# Patient Record
Sex: Female | Born: 1945 | Race: Black or African American | Hispanic: No | Marital: Single | Smoking: Never smoker
Health system: Southern US, Community
[De-identification: ages and names within clinical notes are randomized; demographics above are authoritative.]

## PROBLEM LIST (undated history)

## (undated) HISTORY — PX: OOPHORECTOMY: SHX86

---

## 2004-12-20 ENCOUNTER — Ambulatory Visit: Payer: Self-pay | Admitting: Obstetrics and Gynecology

## 2005-01-11 ENCOUNTER — Ambulatory Visit: Payer: Self-pay | Admitting: Obstetrics and Gynecology

## 2005-01-12 ENCOUNTER — Ambulatory Visit: Payer: Self-pay | Admitting: Obstetrics and Gynecology

## 2006-04-19 ENCOUNTER — Ambulatory Visit: Payer: Self-pay | Admitting: Obstetrics and Gynecology

## 2006-07-12 IMAGING — US ULTRASOUND LEFT BREAST
1 series · 12 of 12 positions shown · non-contrast
Comparison: none

REASON FOR EXAM: Nodular density LEFT breast
COMMENTS:

PROCEDURE:     US  - US BREAST LEFT  - January 12, 2005  [DATE]
RESULT:     A 4-mm hypoechoic nodule containing internal echoes is noted.
This is not a simple cyst.  Surgical removal is suggested.

[Series 1: ultrasound left breast · 12 of 12 slices shown]
[im 1/12]
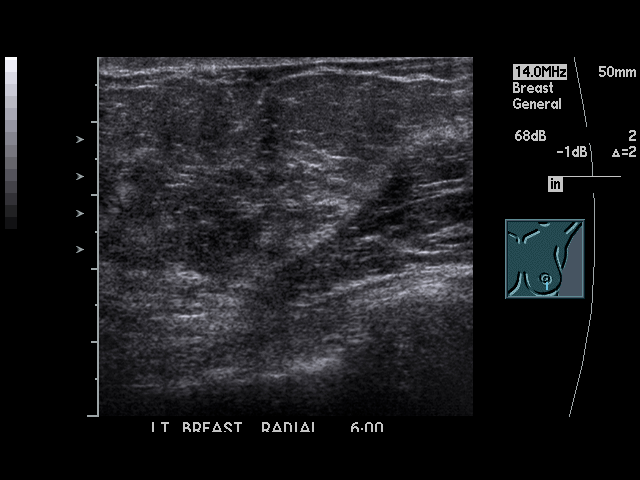
[im 2/12]
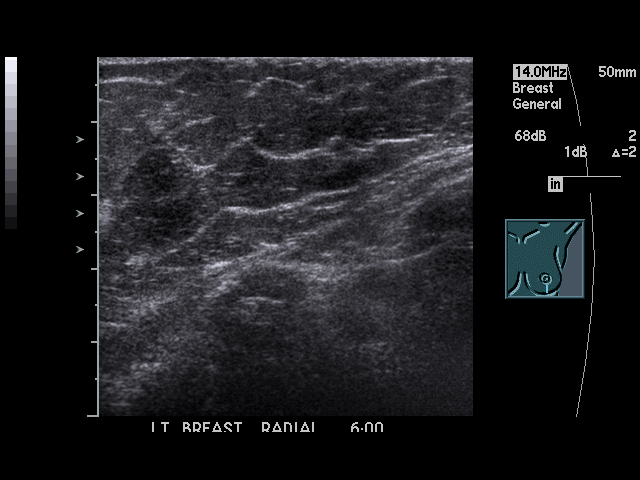
[im 3/12]
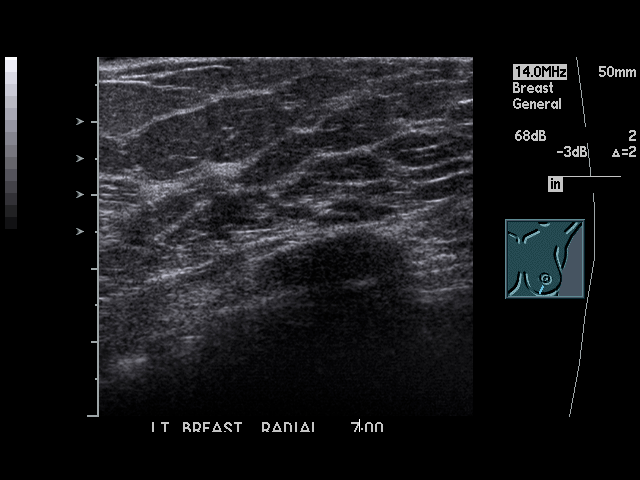
[im 4/12]
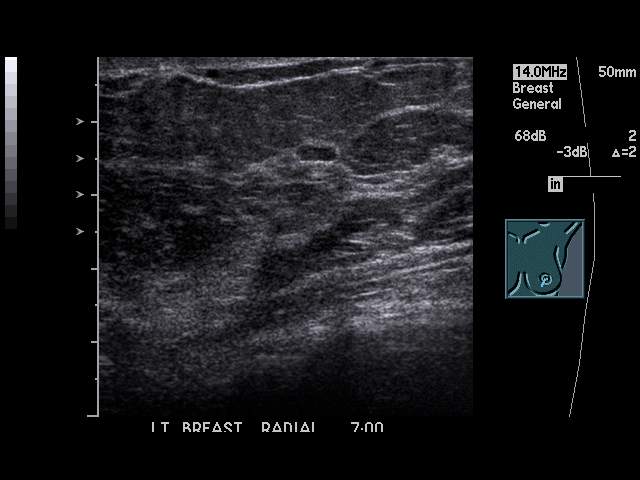
[im 5/12]
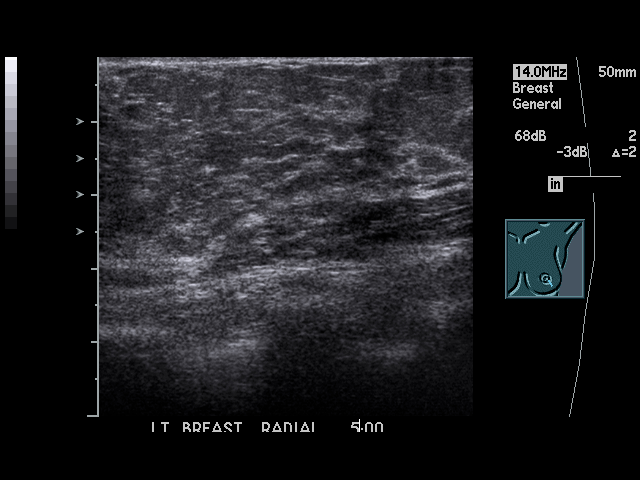
[im 6/12]
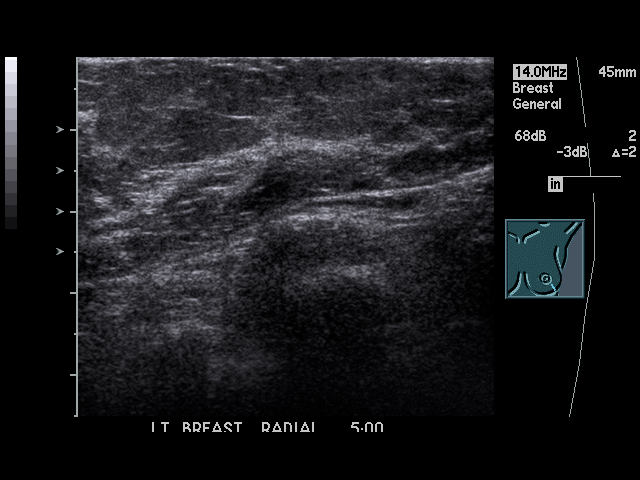
[im 7/12]
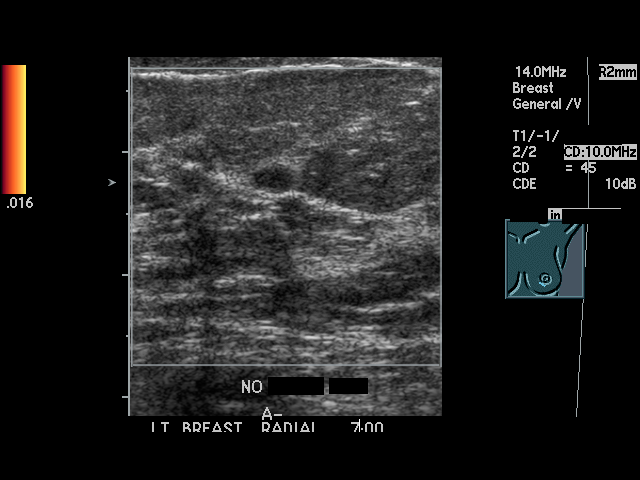
[im 8/12]
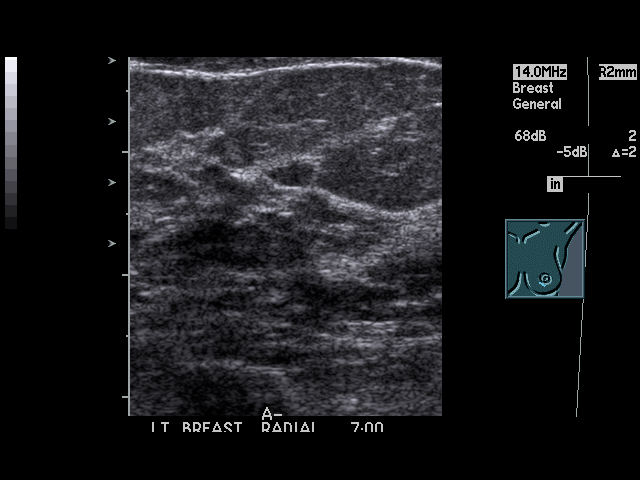
[im 9/12]
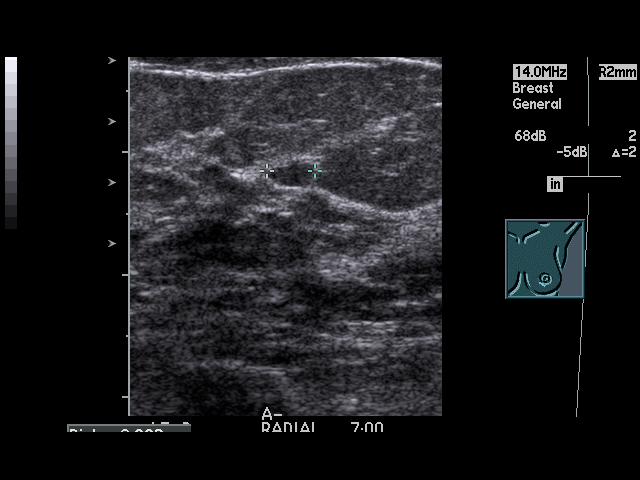
[im 10/12]
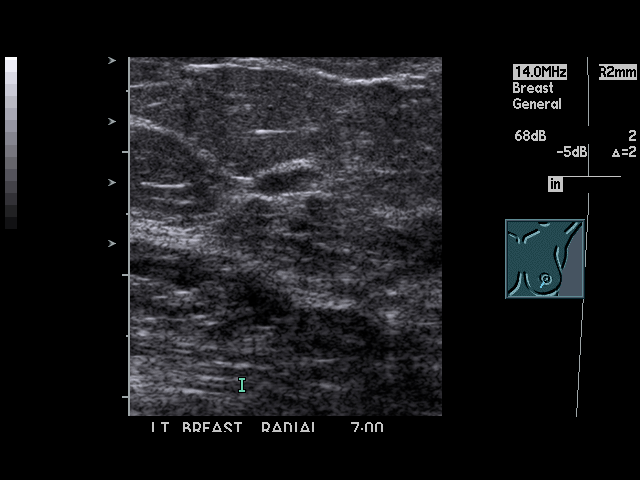
[im 11/12]
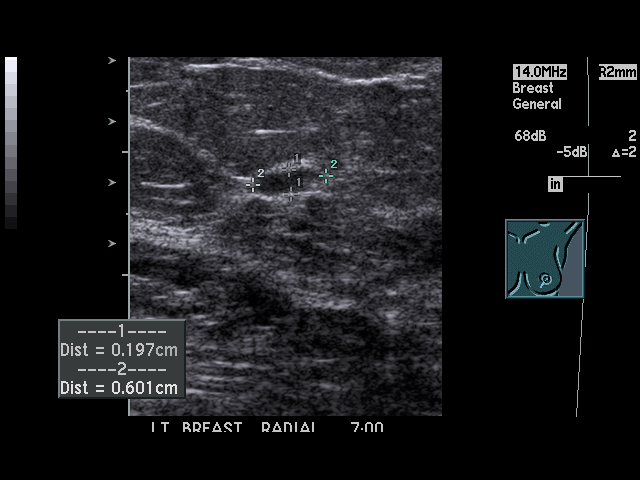
[im 12/12]
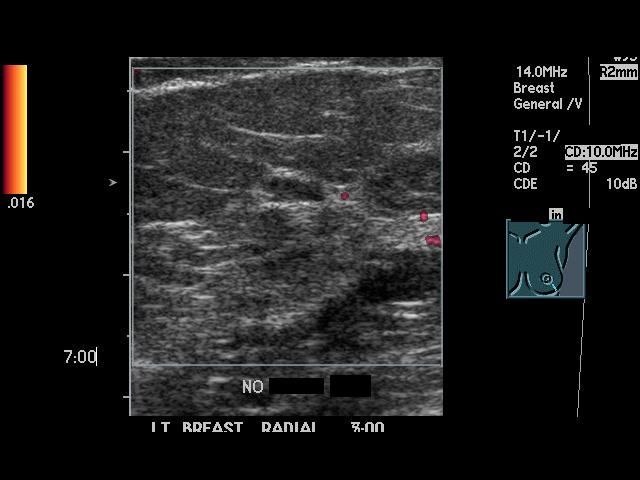

[12 of 12 positions shown; findings below may reference images not displayed]

IMPRESSION: Small 4-mm nodule in the 7 o'clock region of the LEFT breast in the region
of mammographic abnormality.  Surgical removal of this tiny nodule is
suggested.  We can perform needle localization under ultrasound guidance as
desired.

BI-RADS:  Category 4 - Suspicious Abnormality.

## 2007-04-25 ENCOUNTER — Ambulatory Visit: Payer: Self-pay | Admitting: Obstetrics and Gynecology

## 2008-10-08 ENCOUNTER — Ambulatory Visit: Payer: Self-pay | Admitting: Obstetrics and Gynecology

## 2009-11-03 ENCOUNTER — Ambulatory Visit: Payer: Self-pay | Admitting: Obstetrics and Gynecology

## 2010-12-21 ENCOUNTER — Ambulatory Visit: Payer: Self-pay | Admitting: Obstetrics and Gynecology

## 2012-01-02 ENCOUNTER — Ambulatory Visit: Payer: Self-pay | Admitting: Obstetrics and Gynecology

## 2012-02-20 ENCOUNTER — Ambulatory Visit: Payer: Self-pay | Admitting: Gastroenterology

## 2013-01-07 ENCOUNTER — Ambulatory Visit: Payer: Self-pay | Admitting: Obstetrics and Gynecology

## 2014-01-08 ENCOUNTER — Ambulatory Visit: Payer: Self-pay | Admitting: Family Medicine

## 2014-01-20 ENCOUNTER — Ambulatory Visit: Payer: Self-pay | Admitting: Family Medicine

## 2014-12-16 ENCOUNTER — Other Ambulatory Visit: Payer: Self-pay | Admitting: Family Medicine

## 2014-12-16 DIAGNOSIS — Z1231 Encounter for screening mammogram for malignant neoplasm of breast: Secondary | ICD-10-CM

## 2015-01-13 ENCOUNTER — Ambulatory Visit
Admission: RE | Admit: 2015-01-13 | Discharge: 2015-01-13 | Disposition: A | Discharge status: Home / Self Care | Payer: Medicare HMO | Source: Ambulatory Visit | Attending: Family Medicine | Admitting: Family Medicine

## 2015-01-13 ENCOUNTER — Other Ambulatory Visit: Payer: Self-pay | Admitting: Family Medicine

## 2015-01-13 DIAGNOSIS — Z1231 Encounter for screening mammogram for malignant neoplasm of breast: Secondary | ICD-10-CM | POA: Insufficient documentation

## 2015-10-16 ENCOUNTER — Other Ambulatory Visit: Payer: Self-pay | Admitting: Family Medicine

## 2015-10-16 DIAGNOSIS — Z13820 Encounter for screening for osteoporosis: Secondary | ICD-10-CM

## 2015-12-08 ENCOUNTER — Ambulatory Visit
Admission: RE | Admit: 2015-12-08 | Discharge: 2015-12-08 | Disposition: A | Discharge status: Home / Self Care | Payer: Medicare HMO | Source: Ambulatory Visit | Attending: Family Medicine | Admitting: Family Medicine

## 2015-12-08 DIAGNOSIS — Z13820 Encounter for screening for osteoporosis: Secondary | ICD-10-CM | POA: Insufficient documentation

## 2015-12-08 DIAGNOSIS — M8588 Other specified disorders of bone density and structure, other site: Secondary | ICD-10-CM | POA: Diagnosis not present

## 2015-12-15 ENCOUNTER — Other Ambulatory Visit: Payer: Self-pay | Admitting: Family Medicine

## 2015-12-15 DIAGNOSIS — Z1231 Encounter for screening mammogram for malignant neoplasm of breast: Secondary | ICD-10-CM

## 2016-01-21 ENCOUNTER — Ambulatory Visit: Payer: Medicare HMO | Attending: Family Medicine

## 2016-02-15 ENCOUNTER — Ambulatory Visit
Admission: RE | Admit: 2016-02-15 | Discharge: 2016-02-15 | Disposition: A | Discharge status: Home / Self Care | Payer: Medicare HMO | Source: Ambulatory Visit | Attending: Family Medicine | Admitting: Family Medicine

## 2016-02-15 DIAGNOSIS — Z1231 Encounter for screening mammogram for malignant neoplasm of breast: Secondary | ICD-10-CM | POA: Diagnosis present

## 2016-05-28 ENCOUNTER — Emergency Department
Admission: EM | Admit: 2016-05-28 | Discharge: 2016-05-28 | Disposition: A | Discharge status: Home / Self Care | Payer: Medicare HMO | Attending: Emergency Medicine | Admitting: Emergency Medicine

## 2016-05-28 ENCOUNTER — Encounter: Payer: Self-pay | Admitting: Emergency Medicine

## 2016-05-28 DIAGNOSIS — B029 Zoster without complications: Secondary | ICD-10-CM | POA: Insufficient documentation

## 2016-05-28 DIAGNOSIS — R21 Rash and other nonspecific skin eruption: Secondary | ICD-10-CM | POA: Diagnosis present

## 2016-05-28 MED ORDER — LIDOCAINE 5 % EX OINT: 1.0000 "application " | TOPICAL_OINTMENT | CUTANEOUS | 0 refills | Status: AC | PRN

## 2016-05-28 MED ORDER — PREDNISONE 10 MG PO TABS: 50.0000 mg | ORAL_TABLET | Freq: Every day | ORAL | 0 refills | Status: AC

## 2016-05-28 MED ORDER — VALACYCLOVIR HCL 1 G PO TABS: 1000.0000 mg | ORAL_TABLET | Freq: Three times a day (TID) | ORAL | 0 refills | Status: AC

## 2016-05-28 NOTE — ED Triage Notes (Signed)
Pt c/o pain to left side of abdomen then rash came up there yesterday.  Denies fevers. NAD. Ambulatory to triage.

## 2016-05-28 NOTE — ED Provider Notes (Signed)
[facility] Emergency Department Provider Note  ____________________________________________  Time seen: Approximately 7:40 AM  I have reviewed the triage vital signs and the nursing notes.   HISTORY  Chief Complaint Rash   HPI Kelly Pollard is a 71 y.o. female who presents to the emergency department for evaluation of rash that started on her left side yesterday.Prior to rash, she had an intermittent burning, tingling sensation for a couple of days. She can not recall if she had chicken pox as a child and is unsure if she has had the shingles vaccination. She denies fever or new exposures that may explain the rash.  History reviewed. No pertinent past medical history.  There are no active problems to display for this patient.   Past Surgical History:  Procedure Laterality Date  . OOPHORECTOMY      Prior to Admission medications   Medication Sig Start Date End Date Taking? Authorizing Provider  lidocaine (XYLOCAINE) 5 % ointment Apply 1 application topically as needed. 05/28/16   Fantasha Daniele B, FNP  predniSONE (DELTASONE) 10 MG tablet Take 5 tablets (50 mg total) by mouth daily. 05/28/16   Mikeyla Music, Rulon Eisenmenger B, FNP  valACYclovir (VALTREX) 1000 MG tablet Take 1 tablet (1,000 mg total) by mouth 3 (three) times daily. 05/28/16   Chinita Pester, FNP    Allergies Patient has no known allergies.  Family History  Problem Relation Age of Onset  . Breast cancer Sister 85  . Breast cancer Daughter 9    Social History Social History  Substance Use Topics  . Smoking status: Never Smoker  . Smokeless tobacco: Never Used  . Alcohol use No    Review of Systems  Constitutional: Well appearing  Respiratory: Negative for cough or shortness of breath  Musculoskeletal: Negative for body aches  Skin: Positive for rash. Neurological: Positive for tinging sensation on the left thorax.  ____________________________________________   PHYSICAL  EXAM:  VITAL SIGNS: ED Triage Vitals  Enc Vitals Group     BP 05/28/16 0730 139/89     Pulse Rate 05/28/16 0730 75     Resp 05/28/16 0730 16     Temp 05/28/16 0730 98.2 F (36.8 C)     Temp Source 05/28/16 0730 Oral     SpO2 05/28/16 0730 99 %     Weight 05/28/16 0730 136 lb (61.7 kg)     Height 05/28/16 0730 4\' 10"  (1.473 m)     Head Circumference --      Peak Flow --      Pain Score 05/28/16 0737 3     Pain Loc --      Pain Edu? --      Excl. in GC? --      Constitutional: Well appearing. Eyes: Conjunctiva clear without drainage. Nose: No rhinorrhea Mouth/Throat: Mucous membranes moist without lesions Neck: No stridor Lymphatic: No cervical adenopathy  Cardiovascular: Heart rate regular without G/M/R Respiratory: No cough, breath sounds clear throughout. Musculoskeletal: Full ROM throughout Neurologic: GCS 15; No weakness appreciated Skin:  Grouped vesicles on erythematous base over the left T 11 or 12 dermatome.  ____________________________________________   LABS (all labs ordered are listed, but only abnormal results are displayed)  Labs Reviewed - No data to display ____________________________________________  EKG  Not indicated.  ____________________________________________  RADIOLOGY  Not indicated. ____________________________________________   PROCEDURES  Procedure(s) performed: None ____________________________________________   INITIAL IMPRESSION / ASSESSMENT AND PLAN / ED COURSE  Kelly Pollard is a 71 y.o. female  who presents to the ER for evaluation of an intermittently painful rash on the left lateral thorax that is consistent with herpes zoster. She was given Rx for Valacyclovir, Prednisone, and Xylocaine Gel. She was instructed to follow up with her PCP in about a week.  She is to return to the ER for symptoms that change or worsen if unable to schedule an appointment with the PCP.   Pertinent labs & imaging results that were  available during my care of the patient were reviewed by me and considered in my medical decision making (see chart for details). ____________________________________________   FINAL CLINICAL IMPRESSION(S) / ED DIAGNOSES  Final diagnoses:  Herpes zoster without complication    Discharge Medication List as of 05/28/2016  7:52 AM    START taking these medications   Details  lidocaine (XYLOCAINE) 5 % ointment Apply 1 application topically as needed., Starting Sat 05/28/2016, Print    predniSONE (DELTASONE) 10 MG tablet Take 5 tablets (50 mg total) by mouth daily., Starting Sat 05/28/2016, Print    valACYclovir (VALTREX) 1000 MG tablet Take 1 tablet (1,000 mg total) by mouth 3 (three) times daily., Starting Sat 05/28/2016, Print        If controlled substance prescribed during this visit, 12 month history viewed on the [facility] prior to issuing an initial prescription for Schedule II or III opiod.   Note:  This document was prepared using Dragon voice recognition software and may include unintentional dictation errors.    Chinita Pesterriplett, Meganne Rita B, FNP 05/28/16 [id number]    Arnaldo NatalMalinda, Paul F, MD 05/28/16 [phone]

## 2016-12-22 ENCOUNTER — Other Ambulatory Visit: Payer: Self-pay | Admitting: Family Medicine

## 2016-12-22 DIAGNOSIS — Z1231 Encounter for screening mammogram for malignant neoplasm of breast: Secondary | ICD-10-CM

## 2017-03-09 ENCOUNTER — Ambulatory Visit
Admission: RE | Admit: 2017-03-09 | Discharge: 2017-03-09 | Disposition: A | Discharge status: Home / Self Care | Payer: Medicare HMO | Source: Ambulatory Visit | Attending: Family Medicine | Admitting: Family Medicine

## 2017-03-09 DIAGNOSIS — Z1231 Encounter for screening mammogram for malignant neoplasm of breast: Secondary | ICD-10-CM | POA: Diagnosis not present

## 2018-02-01 ENCOUNTER — Other Ambulatory Visit: Payer: Self-pay | Admitting: Family Medicine

## 2018-02-01 DIAGNOSIS — Z1231 Encounter for screening mammogram for malignant neoplasm of breast: Secondary | ICD-10-CM

## 2018-02-01 DIAGNOSIS — Z13820 Encounter for screening for osteoporosis: Secondary | ICD-10-CM

## 2018-03-13 ENCOUNTER — Ambulatory Visit
Admission: RE | Admit: 2018-03-13 | Discharge: 2018-03-13 | Disposition: A | Discharge status: Home / Self Care | Payer: Medicare HMO | Source: Ambulatory Visit | Attending: Family Medicine | Admitting: Family Medicine

## 2018-03-13 DIAGNOSIS — Z1231 Encounter for screening mammogram for malignant neoplasm of breast: Secondary | ICD-10-CM | POA: Diagnosis present

## 2018-03-13 DIAGNOSIS — Z13820 Encounter for screening for osteoporosis: Secondary | ICD-10-CM | POA: Diagnosis present

## 2018-03-13 DIAGNOSIS — M8588 Other specified disorders of bone density and structure, other site: Secondary | ICD-10-CM | POA: Diagnosis not present

## 2018-12-20 ENCOUNTER — Other Ambulatory Visit: Payer: Self-pay

## 2018-12-20 DIAGNOSIS — Z20822 Contact with and (suspected) exposure to covid-19: Secondary | ICD-10-CM

## 2018-12-22 LAB — NOVEL CORONAVIRUS, NAA: SARS-CoV-2, NAA: DETECTED — AB

## 2019-02-08 ENCOUNTER — Other Ambulatory Visit: Payer: Self-pay | Admitting: Family Medicine

## 2019-02-08 DIAGNOSIS — Z1231 Encounter for screening mammogram for malignant neoplasm of breast: Secondary | ICD-10-CM

## 2019-03-14 ENCOUNTER — Ambulatory Visit
Admission: RE | Admit: 2019-03-14 | Discharge: 2019-03-14 | Disposition: A | Discharge status: Home / Self Care | Payer: Medicare HMO | Source: Ambulatory Visit | Attending: Family Medicine | Admitting: Family Medicine

## 2019-03-14 DIAGNOSIS — Z1231 Encounter for screening mammogram for malignant neoplasm of breast: Secondary | ICD-10-CM | POA: Diagnosis present

## 2020-02-18 ENCOUNTER — Other Ambulatory Visit: Payer: Self-pay | Admitting: Family Medicine

## 2020-02-18 DIAGNOSIS — Z1231 Encounter for screening mammogram for malignant neoplasm of breast: Secondary | ICD-10-CM

## 2020-03-16 ENCOUNTER — Other Ambulatory Visit: Payer: Self-pay

## 2020-03-16 ENCOUNTER — Ambulatory Visit
Admission: RE | Admit: 2020-03-16 | Discharge: 2020-03-16 | Disposition: A | Discharge status: Home / Self Care | Payer: Medicare HMO | Source: Ambulatory Visit | Attending: Family Medicine | Admitting: Family Medicine

## 2020-03-16 DIAGNOSIS — Z1231 Encounter for screening mammogram for malignant neoplasm of breast: Secondary | ICD-10-CM | POA: Insufficient documentation

## 2020-07-24 ENCOUNTER — Other Ambulatory Visit: Payer: Self-pay | Admitting: Family Medicine

## 2020-07-24 DIAGNOSIS — Z0000 Encounter for general adult medical examination without abnormal findings: Secondary | ICD-10-CM

## 2021-02-10 ENCOUNTER — Other Ambulatory Visit: Payer: Self-pay | Admitting: Family Medicine

## 2021-02-10 DIAGNOSIS — Z1231 Encounter for screening mammogram for malignant neoplasm of breast: Secondary | ICD-10-CM

## 2021-03-22 ENCOUNTER — Other Ambulatory Visit: Payer: Self-pay

## 2021-03-22 ENCOUNTER — Ambulatory Visit
Admission: RE | Admit: 2021-03-22 | Discharge: 2021-03-22 | Disposition: A | Discharge status: Home / Self Care | Payer: Medicare HMO | Source: Ambulatory Visit | Attending: Family Medicine | Admitting: Family Medicine

## 2021-03-22 DIAGNOSIS — Z780 Asymptomatic menopausal state: Secondary | ICD-10-CM | POA: Diagnosis not present

## 2021-03-22 DIAGNOSIS — Z13820 Encounter for screening for osteoporosis: Secondary | ICD-10-CM | POA: Diagnosis not present

## 2021-03-22 DIAGNOSIS — Z1231 Encounter for screening mammogram for malignant neoplasm of breast: Secondary | ICD-10-CM | POA: Insufficient documentation

## 2021-03-22 DIAGNOSIS — M8589 Other specified disorders of bone density and structure, multiple sites: Secondary | ICD-10-CM | POA: Insufficient documentation

## 2021-03-22 DIAGNOSIS — Z0000 Encounter for general adult medical examination without abnormal findings: Secondary | ICD-10-CM

## 2022-02-16 ENCOUNTER — Other Ambulatory Visit: Payer: Self-pay | Admitting: Family Medicine

## 2022-02-16 DIAGNOSIS — Z1231 Encounter for screening mammogram for malignant neoplasm of breast: Secondary | ICD-10-CM

## 2022-03-24 ENCOUNTER — Ambulatory Visit
Admission: RE | Admit: 2022-03-24 | Discharge: 2022-03-24 | Disposition: A | Discharge status: Home / Self Care | Payer: Medicare HMO | Source: Ambulatory Visit | Attending: Family Medicine | Admitting: Family Medicine

## 2022-03-24 DIAGNOSIS — Z1231 Encounter for screening mammogram for malignant neoplasm of breast: Secondary | ICD-10-CM | POA: Diagnosis present

## 2022-09-19 IMAGING — MG MM DIGITAL SCREENING BILAT W/ TOMO AND CAD
8 series · 8 of 24 positions shown · non-contrast
Comparison: Previous exam(s).

CLINICAL DATA: Screening.

EXAM:
DIGITAL SCREENING BILATERAL MAMMOGRAM WITH TOMOSYNTHESIS AND CAD
TECHNIQUE: Bilateral screening digital craniocaudal and mediolateral oblique
mammograms were obtained. Bilateral screening digital breast
tomosynthesis was performed. The images were evaluated with
computer-aided detection.

[R MLO synth-2D]
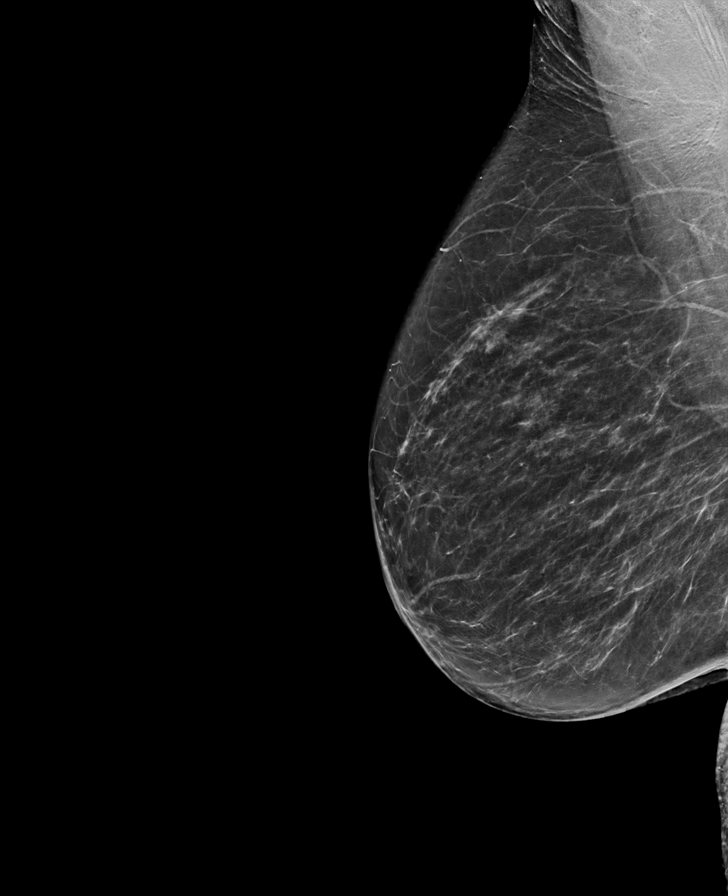

[L CC synth-2D]
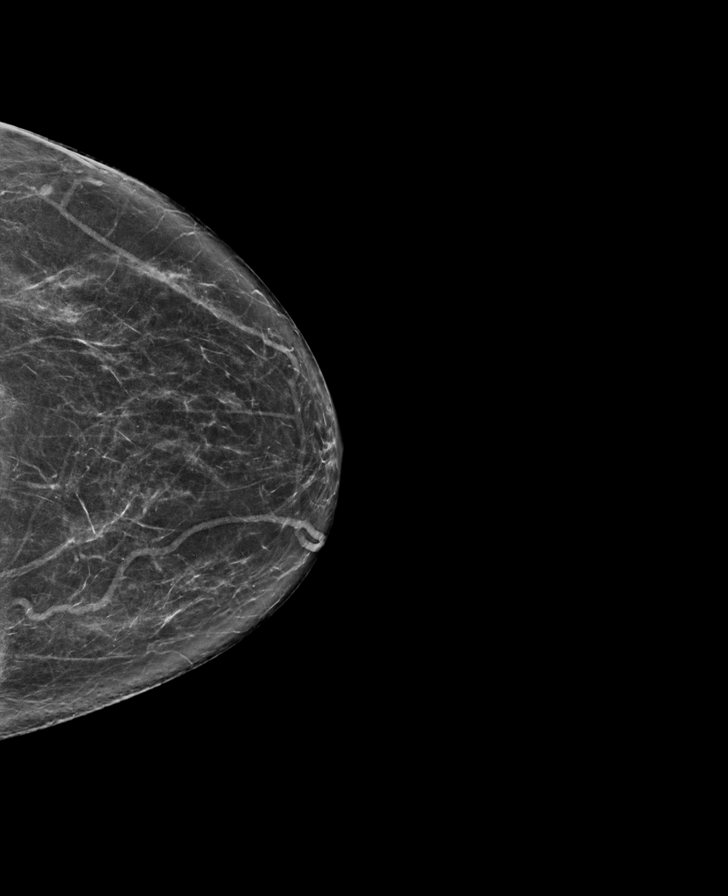

[L MLO synth-2D]
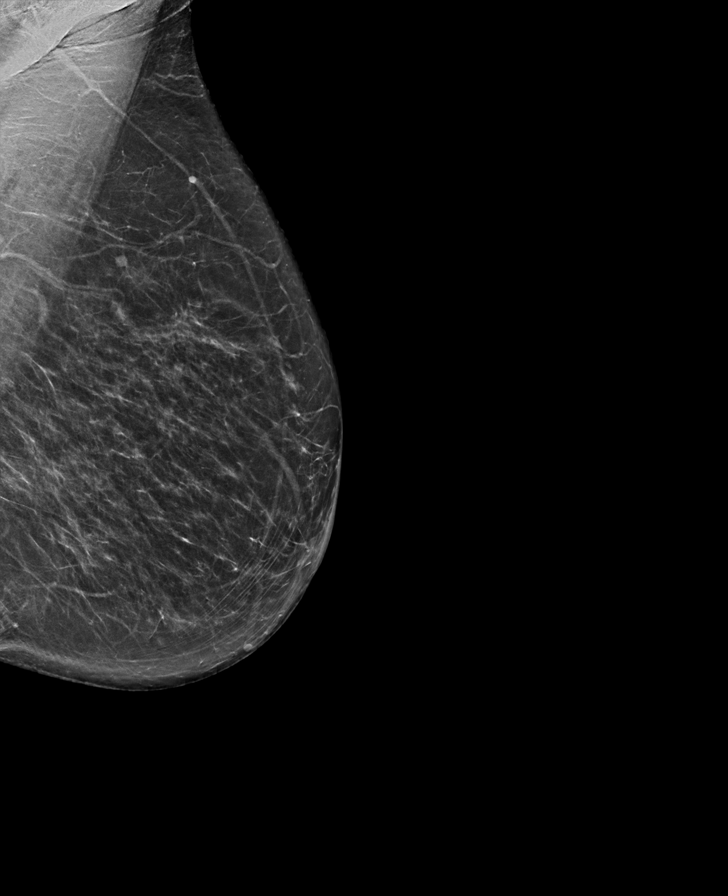

[R CC synth-2D]
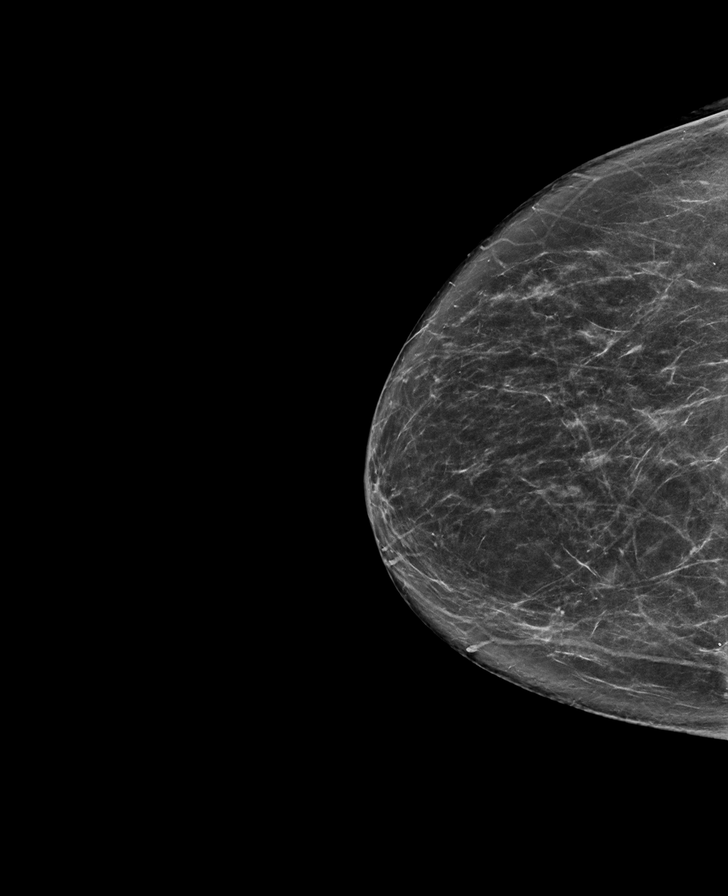

[L MLO tomo · tomo slice 35/68.0]
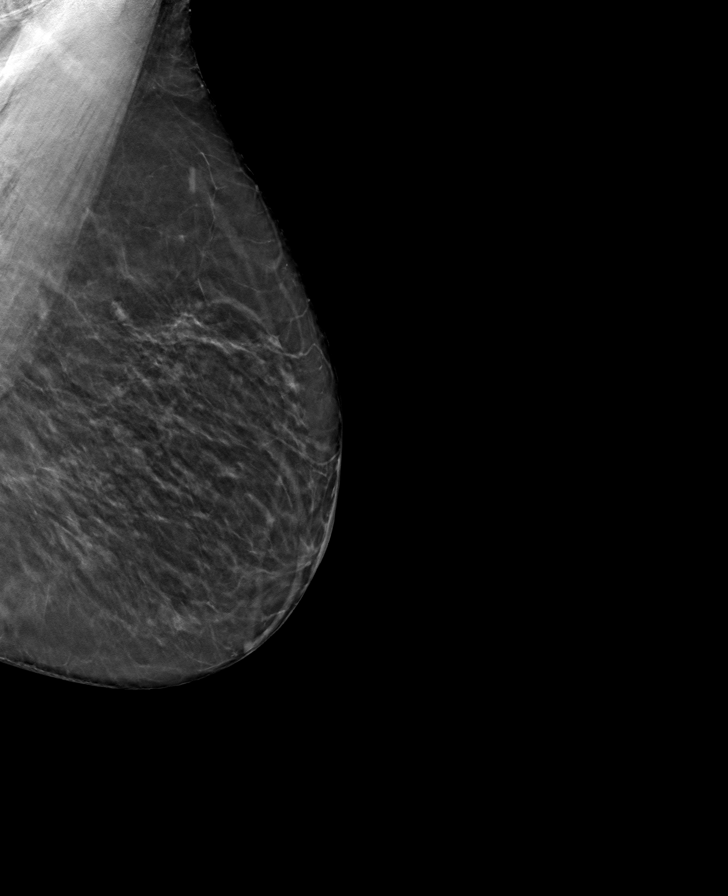

[L CC tomo · tomo slice 35/68.0]
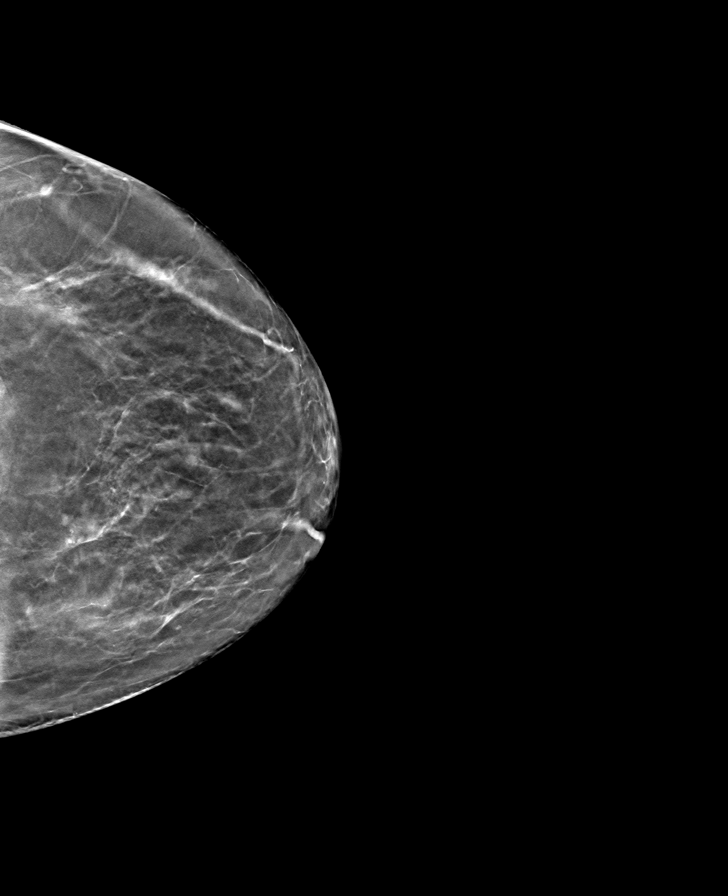

[R MLO tomo · tomo slice 38/75.0]
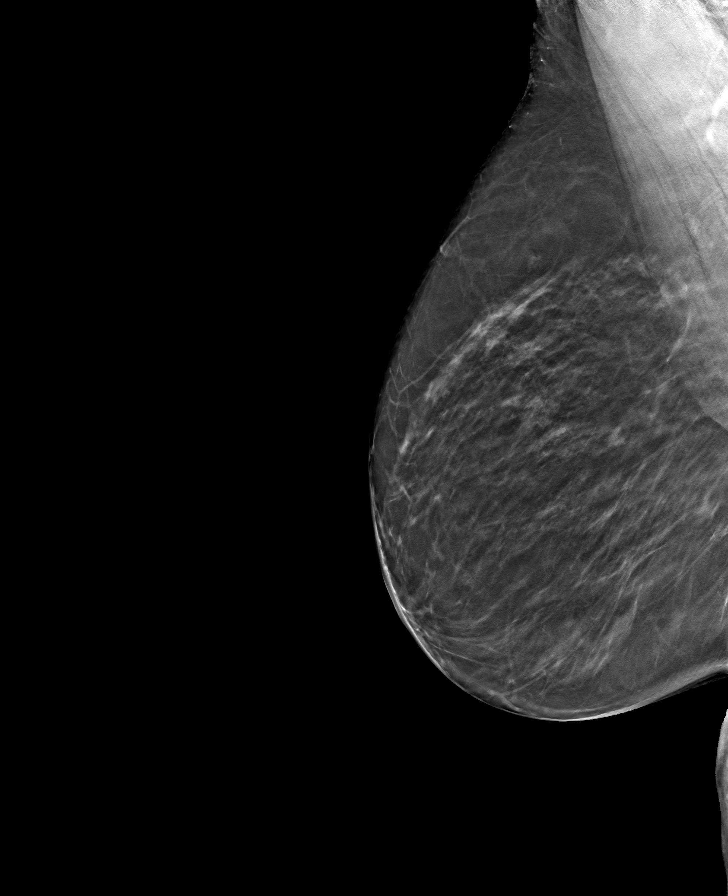

[R CC tomo · tomo slice 39/78.0]
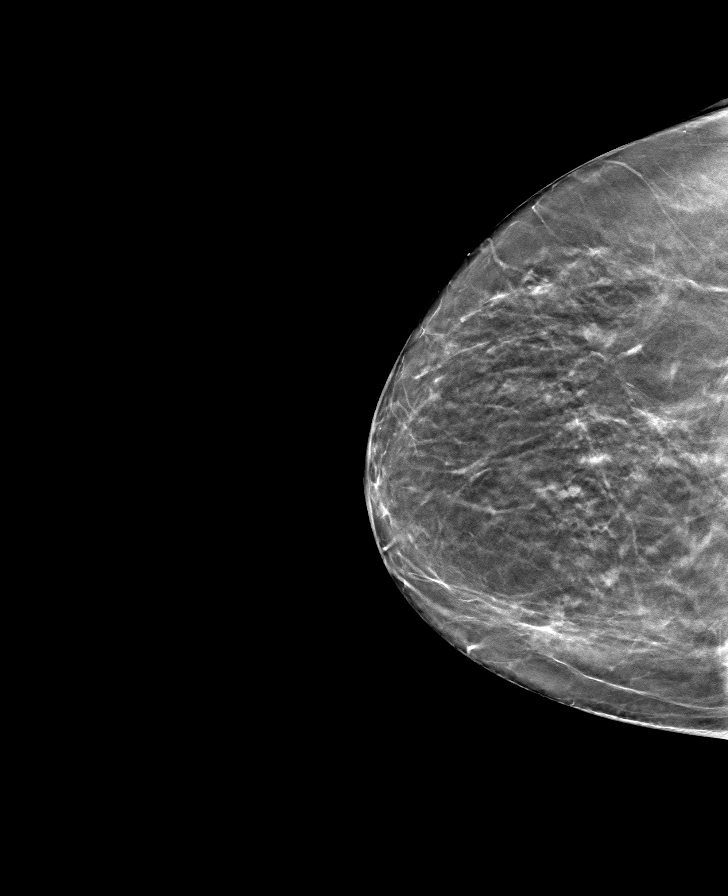

[8 of 24 positions shown; findings below may reference images not displayed]

ACR Breast Density Category b: There are scattered areas of
fibroglandular density.
FINDINGS: There are no findings suspicious for malignancy.
IMPRESSION: No mammographic evidence of malignancy. A result letter of this
screening mammogram will be mailed directly to the patient.

RECOMMENDATION:
Screening mammogram in one year. (Code:51-O-LD2)

BI-RADS CATEGORY  1: Negative.

## 2023-03-08 ENCOUNTER — Other Ambulatory Visit: Payer: Self-pay | Admitting: Family Medicine

## 2023-03-08 DIAGNOSIS — Z1231 Encounter for screening mammogram for malignant neoplasm of breast: Secondary | ICD-10-CM

## 2023-03-27 ENCOUNTER — Ambulatory Visit
Admission: RE | Admit: 2023-03-27 | Discharge: 2023-03-27 | Disposition: A | Discharge status: Home / Self Care | Payer: Medicare HMO | Source: Ambulatory Visit | Attending: Family Medicine | Admitting: Family Medicine

## 2023-03-27 DIAGNOSIS — Z1231 Encounter for screening mammogram for malignant neoplasm of breast: Secondary | ICD-10-CM | POA: Diagnosis present
